# Patient Record
Sex: Female | Born: 1990 | Hispanic: Yes | Marital: Single | Smoking: Never smoker
Health system: Southern US, Community
[De-identification: ages and names within clinical notes are randomized; demographics above are authoritative.]

## PROBLEM LIST (undated history)

## (undated) DIAGNOSIS — N200 Calculus of kidney: Secondary | ICD-10-CM

---

## 2017-12-01 DIAGNOSIS — Z3041 Encounter for surveillance of contraceptive pills: Secondary | ICD-10-CM | POA: Insufficient documentation

## 2019-03-03 ENCOUNTER — Emergency Department: Payer: Managed Care, Other (non HMO)

## 2019-03-03 ENCOUNTER — Encounter: Payer: Self-pay | Admitting: Emergency Medicine

## 2019-03-03 ENCOUNTER — Emergency Department
Admission: EM | Admit: 2019-03-03 | Discharge: 2019-03-03 | Disposition: A | Discharge status: Home / Self Care | Payer: Managed Care, Other (non HMO) | Attending: Emergency Medicine | Admitting: Emergency Medicine

## 2019-03-03 DIAGNOSIS — N200 Calculus of kidney: Secondary | ICD-10-CM | POA: Insufficient documentation

## 2019-03-03 DIAGNOSIS — Z79899 Other long term (current) drug therapy: Secondary | ICD-10-CM | POA: Insufficient documentation

## 2019-03-03 DIAGNOSIS — R1011 Right upper quadrant pain: Secondary | ICD-10-CM | POA: Diagnosis present

## 2019-03-03 LAB — BASIC METABOLIC PANEL
Anion gap: 11 (ref 5–15)
BUN: 14 mg/dL (ref 6–20)
CO2: 27 mmol/L (ref 22–32)
Calcium: 9.3 mg/dL (ref 8.9–10.3)
Chloride: 103 mmol/L (ref 98–111)
Creatinine, Ser: 1 mg/dL (ref 0.44–1.00)
GFR calc Af Amer: >60 mL/min (ref >60)
GFR calc non Af Amer: >60 mL/min (ref >60)
Glucose, Bld: 123 mg/dL — ABNORMAL HIGH (ref 70–99)
Potassium: 4 mmol/L (ref 3.5–5.1)
Sodium: 141 mmol/L (ref 135–145)

## 2019-03-03 LAB — URINALYSIS, COMPLETE (UACMP) WITH MICROSCOPIC
Bilirubin Urine: NEGATIVE
Glucose, UA: NEGATIVE mg/dL
Ketones, ur: 5 mg/dL — AB
Leukocytes,Ua: NEGATIVE
Nitrite: NEGATIVE
Protein, ur: 30 mg/dL — AB
RBC / HPF: >50 RBC/hpf — ABNORMAL HIGH (ref 0–5)
Specific Gravity, Urine: 1.023 (ref 1.005–1.030)
pH: 6 (ref 5.0–8.0)

## 2019-03-03 LAB — CBC
HCT: 43.5 % (ref 36.0–46.0)
Hemoglobin: 14.6 g/dL (ref 12.0–15.0)
MCH: 30.5 pg (ref 26.0–34.0)
MCHC: 33.6 g/dL (ref 30.0–36.0)
MCV: 91 fL (ref 80.0–100.0)
Platelets: 268 10*3/uL (ref 150–400)
RBC: 4.78 MIL/uL (ref 3.87–5.11)
RDW: 11.9 % (ref 11.5–15.5)
WBC: 12 10*3/uL — ABNORMAL HIGH (ref 4.0–10.5)
nRBC: 0 % (ref 0.0–0.2)

## 2019-03-03 MED ORDER — HYDROCODONE-ACETAMINOPHEN 5-325 MG PO TABS: 1.0000 | ORAL_TABLET | Freq: Four times a day (QID) | ORAL | 0 refills | Status: DC | PRN

## 2019-03-03 MED ORDER — ONDANSETRON HCL 4 MG/2ML IJ SOLN
4.0000 mg | Freq: Once | INTRAMUSCULAR | Status: AC
  Administered 2019-03-03: 4 mg via INTRAVENOUS
  Filled 2019-03-03: qty 2

## 2019-03-03 MED ORDER — NAPROXEN 500 MG PO TABS: 500.0000 mg | ORAL_TABLET | Freq: Two times a day (BID) | ORAL | 2 refills | Status: DC

## 2019-03-03 MED ORDER — KETOROLAC TROMETHAMINE 30 MG/ML IJ SOLN
30.0000 mg | Freq: Once | INTRAMUSCULAR | Status: AC
  Administered 2019-03-03: 30 mg via INTRAVENOUS
  Filled 2019-03-03: qty 1

## 2019-03-03 MED ORDER — ONDANSETRON 4 MG PO TBDP: 4.0000 mg | ORAL_TABLET | Freq: Three times a day (TID) | ORAL | 0 refills | Status: DC | PRN

## 2019-03-03 NOTE — ED Triage Notes (Signed)
Pt to ED with c/o of right side pain that started this morning. Pt states N/V but denies diarrhea.Pt states she has noticed burning with urination. Pt is afebrile in triage.

## 2019-03-03 NOTE — ED Notes (Signed)
Negative pregnancy

## 2019-03-03 NOTE — ED Provider Notes (Signed)
[facility] Emergency Department Provider Note   ____________________________________________    I have reviewed the triage vital signs and the nursing notes.   HISTORY  Chief Complaint Flank Pain     HPI Andrea Castillo is a 28 y.o. female who presents with complaints of right flank pain, patient reports the pain started about 2 hours prior to arrival she describes sharp pain radiating into her groin on the right.  No history of similar symptoms in the past.  No fevers or chills.  Positive nausea.  Is not take anything for this.  Reports she is to start her period in 1 week.  Has not noticed any hematuria, no dysuria.  History reviewed. No pertinent past medical history.  There are no active problems to display for this patient.   History reviewed. No pertinent surgical history.  Prior to Admission medications   Medication Sig Start Date End Date Taking? Authorizing Provider  HYDROcodone-acetaminophen (NORCO/VICODIN) 5-325 MG tablet Take 1 tablet by mouth every 6 (six) hours as needed for severe pain. 03/03/19   Jene EveryKinner, Raaga Maeder, MD  naproxen (NAPROSYN) 500 MG tablet Take 1 tablet (500 mg total) by mouth 2 (two) times daily with a meal. 03/03/19   Jene EveryKinner, Aerial Dilley, MD  ondansetron (ZOFRAN ODT) 4 MG disintegrating tablet Take 1 tablet (4 mg total) by mouth every 8 (eight) hours as needed. 03/03/19   Jene EveryKinner, Konnie Noffsinger, MD     Allergies Patient has no known allergies.  History reviewed. No pertinent family history.  Social History Social History   Tobacco Use  . Smoking status: Never Smoker  . Smokeless tobacco: Never Used  Substance Use Topics  . Alcohol use: Not Currently  . Drug use: Yes    Types: Marijuana    Review of Systems  Constitutional: No fever/chills Eyes: No visual changes.  ENT: No sore throat. Cardiovascular: Denies chest pain. Respiratory: Denies shortness of breath. Gastrointestinal: As above Genitourinary: As above  Musculoskeletal: Negative for back pain. Skin: Negative for rash. Neurological: Negative for headaches or weakness   ____________________________________________   PHYSICAL EXAM:  VITAL SIGNS: ED Triage Vitals  Enc Vitals Group     BP 03/03/19 2104 (!) 142/89     Pulse Rate 03/03/19 2104 74     Resp 03/03/19 2104 18     Temp 03/03/19 2104 97.6 F (36.4 C)     Temp Source 03/03/19 2104 Oral     SpO2 03/03/19 2104 100 %     Weight 03/03/19 2105 73.5 kg (162 lb)     Height 03/03/19 2105 1.575 m (5\' 2" )     Head Circumference --      Peak Flow --      Pain Score 03/03/19 2104 10     Pain Loc --      Pain Edu? --      Excl. in GC? --     Constitutional: Alert and oriented.  Uncomfortable, sitting on bench Eyes: Conjunctivae are normal.   Nose: No congestion/rhinnorhea. Mouth/Throat: Mucous membranes are moist.   Neck:  Painless ROM Cardiovascular: Normal rate, regular rhythm.  Good peripheral circulation. Respiratory: Normal respiratory effort.  No retractions.  Gastrointestinal: Soft and nontender. No distention.  No CVA tenderness.  Musculoskeletal:.  Warm and well perfused Neurologic:  Normal speech and language. No gross focal neurologic deficits are appreciated.  Skin:  Skin is warm, dry and intact. No rash noted. Psychiatric: Mood and affect are normal. Speech and behavior are normal.  ____________________________________________   LABS (all labs ordered are listed, but only abnormal results are displayed)  Labs Reviewed  URINALYSIS, COMPLETE (UACMP) WITH MICROSCOPIC - Abnormal; Notable for the following components:      Result Value   Color, Urine YELLOW (*)    APPearance CLOUDY (*)    Hgb urine dipstick LARGE (*)    Ketones, ur 5 (*)    Protein, ur 30 (*)    RBC / HPF >50 (*)    Bacteria, UA RARE (*)    All other components within normal limits  BASIC METABOLIC PANEL - Abnormal; Notable for the following components:   Glucose, Bld 123 (*)    All  other components within normal limits  CBC - Abnormal; Notable for the following components:   WBC 12.0 (*)    All other components within normal limits  POC URINE PREG, ED   ____________________________________________  EKG   ____________________________________________  RADIOLOGY  CT scan demonstrates 6 mm right ureteral stone ____________________________________________   PROCEDURES  Procedure(s) performed: No  Procedures   Critical Care performed: No ____________________________________________   INITIAL IMPRESSION / ASSESSMENT AND PLAN / ED COURSE  Pertinent labs & imaging results that were available during my care of the patient were reviewed by me and considered in my medical decision making (see chart for details).  Patient's abrupt onset of pain is suspicious for ureterolithiasis, UTI /pyelonephritis is also on the differential.  Pregnancy test is negative we will give IV Toradol, IV Zofran obtain CT renal stone study and reevaluate   Urinalysis demonstrates hemoglobin, no evidence of infection, CT confirms 6 mm stone UVJ.  Patient's pain is relieved after Toradol, will discharge with analgesics Zofran, outpatient follow-up with urology, return precautions discussed    ____________________________________________   FINAL CLINICAL IMPRESSION(S) / ED DIAGNOSES  Final diagnoses:  Kidney stone        Note:  This document was prepared using Dragon voice recognition software and may include unintentional dictation errors.   Jene Every, MD 03/03/19 [phone]

## 2019-03-07 ENCOUNTER — Emergency Department
Admission: EM | Admit: 2019-03-07 | Discharge: 2019-03-07 | Disposition: A | Discharge status: Home / Self Care | Payer: Managed Care, Other (non HMO) | Attending: Student in an Organized Health Care Education/Training Program | Admitting: Student in an Organized Health Care Education/Training Program

## 2019-03-07 ENCOUNTER — Other Ambulatory Visit: Payer: Self-pay

## 2019-03-07 ENCOUNTER — Emergency Department: Payer: Managed Care, Other (non HMO)

## 2019-03-07 DIAGNOSIS — R110 Nausea: Secondary | ICD-10-CM | POA: Diagnosis not present

## 2019-03-07 DIAGNOSIS — R109 Unspecified abdominal pain: Secondary | ICD-10-CM

## 2019-03-07 DIAGNOSIS — N201 Calculus of ureter: Secondary | ICD-10-CM | POA: Diagnosis not present

## 2019-03-07 DIAGNOSIS — F1210 Cannabis abuse, uncomplicated: Secondary | ICD-10-CM | POA: Diagnosis not present

## 2019-03-07 HISTORY — DX: Calculus of kidney: N20.0

## 2019-03-07 LAB — BASIC METABOLIC PANEL
Anion gap: 12 (ref 5–15)
BUN: 16 mg/dL (ref 6–20)
CO2: 22 mmol/L (ref 22–32)
Calcium: 8.6 mg/dL — ABNORMAL LOW (ref 8.9–10.3)
Chloride: 102 mmol/L (ref 98–111)
Creatinine, Ser: 1.22 mg/dL — ABNORMAL HIGH (ref 0.44–1.00)
GFR calc Af Amer: >60 mL/min (ref >60)
GFR calc non Af Amer: >60 mL/min (ref >60)
Glucose, Bld: 86 mg/dL (ref 70–99)
Potassium: 4.2 mmol/L (ref 3.5–5.1)
Sodium: 136 mmol/L (ref 135–145)

## 2019-03-07 LAB — CBC WITH DIFFERENTIAL/PLATELET
Abs Immature Granulocytes: 0.04 10*3/uL (ref 0.00–0.07)
Basophils Absolute: 0 10*3/uL (ref 0.0–0.1)
Basophils Relative: 0 %
Eosinophils Absolute: 0.1 10*3/uL (ref 0.0–0.5)
Eosinophils Relative: 1 %
HCT: 41.8 % (ref 36.0–46.0)
Hemoglobin: 14 g/dL (ref 12.0–15.0)
Immature Granulocytes: 0 %
Lymphocytes Relative: 8 %
Lymphs Abs: 0.9 10*3/uL (ref 0.7–4.0)
MCH: 30.2 pg (ref 26.0–34.0)
MCHC: 33.5 g/dL (ref 30.0–36.0)
MCV: 90.1 fL (ref 80.0–100.0)
Monocytes Absolute: 0.8 10*3/uL (ref 0.1–1.0)
Monocytes Relative: 7 %
Neutro Abs: 10.2 10*3/uL — ABNORMAL HIGH (ref 1.7–7.7)
Neutrophils Relative %: 84 %
Platelets: 252 10*3/uL (ref 150–400)
RBC: 4.64 MIL/uL (ref 3.87–5.11)
RDW: 12 % (ref 11.5–15.5)
WBC: 12 10*3/uL — ABNORMAL HIGH (ref 4.0–10.5)
nRBC: 0 % (ref 0.0–0.2)

## 2019-03-07 LAB — URINALYSIS, COMPLETE (UACMP) WITH MICROSCOPIC
Bacteria, UA: NONE SEEN
Bilirubin Urine: NEGATIVE
Glucose, UA: NEGATIVE mg/dL
Ketones, ur: 80 mg/dL — AB
Nitrite: NEGATIVE
Protein, ur: 30 mg/dL — AB
Specific Gravity, Urine: 1.025 (ref 1.005–1.030)
pH: 5 (ref 5.0–8.0)

## 2019-03-07 MED ORDER — MORPHINE SULFATE (PF) 4 MG/ML IV SOLN
4.0000 mg | INTRAVENOUS | Status: DC | PRN
  Administered 2019-03-07: 13:00:00 4 mg via INTRAVENOUS
  Filled 2019-03-07: qty 1

## 2019-03-07 MED ORDER — CEPHALEXIN 500 MG PO CAPS
500.0000 mg | ORAL_CAPSULE | Freq: Once | ORAL | Status: AC
  Administered 2019-03-07: 16:00:00 500 mg via ORAL
  Filled 2019-03-07: qty 1

## 2019-03-07 MED ORDER — ONDANSETRON HCL 4 MG/2ML IJ SOLN
4.0000 mg | Freq: Once | INTRAMUSCULAR | Status: AC
  Administered 2019-03-07: 4 mg via INTRAVENOUS
  Filled 2019-03-07: qty 2

## 2019-03-07 MED ORDER — HYDROCODONE-ACETAMINOPHEN 5-325 MG PO TABS: 1.0000 | ORAL_TABLET | ORAL | 0 refills | Status: DC | PRN

## 2019-03-07 MED ORDER — CEPHALEXIN 500 MG PO CAPS: 500.0000 mg | ORAL_CAPSULE | Freq: Three times a day (TID) | ORAL | 0 refills | Status: DC

## 2019-03-07 MED ORDER — PROCHLORPERAZINE MALEATE 5 MG PO TABS: 5.0000 mg | ORAL_TABLET | Freq: Four times a day (QID) | ORAL | 0 refills | Status: DC | PRN

## 2019-03-07 NOTE — H&P (Signed)
Urology Consult  I have been asked to see the patient by Dr. Roxan Hockeyobinson, for evaluation and management of right ureteral stone.  Chief Complaint: right flank pain  History of Present Illness: March RummageMaria Elbia Castillo is a 28 y.o. year old female presenting to the emergency room with a 6 mm right distal ureteral calculus.  She was seen on 5/23 in the emergency room with acute onset right lower quadrant pain found to have a 6 mm right UVJ stone.  At that time, there is no concern for infection she was sent home with pain medications and expectant management.  She continued to have intermittent episodes of right lower quadrant pain, nausea with occasional vomiting, and dark urine.  No fevers or chills.  No dysuria.  No urgency or frequency.  She returned to the emergency room again today with ongoing severe pain.  No personal history of kidney stones.  Past Medical History:  Diagnosis Date   Kidney stone     History reviewed. No pertinent surgical history.  Home Medications:  No outpatient medications have been marked as taking for the 03/07/19 encounter [facility](Hospital Encounter).    Allergies: No Known Allergies  No family history on file.  Social History:  reports that she has never smoked. She has never used smokeless tobacco. She reports previous alcohol use. She reports current drug use. Drug: Marijuana.  ROS: A complete review of systems was performed.  All systems are negative except for pertinent findings as noted.  Physical Exam:  Vital signs in last 24 hours: Temp:  [98.2 F (36.8 C)] 98.2 F (36.8 C) (05/27 1052) Pulse Rate:  [72-83] 75 (05/27 1530) Resp:  [16] 16 (05/27 1052) BP: (125-131)/(78-83) 125/83 (05/27 1530) SpO2:  [98 %-100 %] 100 % (05/27 1530) Weight:  [73.5 kg] 73.5 kg (05/27 1054) Constitutional:  Alert and oriented, No acute distress HEENT: [place] AT, moist mucus membranes.  Trachea midline, no masses Cardiovascular: Regular rate and rhythm, no  clubbing, cyanosis, or edema. Respiratory: Normal respiratory effort, lungs clear bilaterally GI: Abdomen is soft, mild tenderness in right lower quadrant GU: No CVA tenderness Skin: No rashes, bruises or suspicious lesions Neurologic: Grossly intact, no focal deficits, moving all 4 extremities Psychiatric: Normal mood and affect   Laboratory Data:  Recent Labs    03/07/19 1248  WBC 12.0*  HGB 14.0  HCT 41.8   Recent Labs    03/07/19 1248  NA 136  K 4.2  CL 102  CO2 22  GLUCOSE 86  BUN 16  CREATININE 1.22*  CALCIUM 8.6*   Component     Latest Ref Rng & Units 03/07/2019  Color, Urine     YELLOW YELLOW (A)  Appearance     CLEAR HAZY (A)  Specific Gravity, Urine     1.005 - 1.030 1.025  pH     5.0 - 8.0 5.0  Glucose, UA     NEGATIVE mg/dL NEGATIVE  Hgb urine dipstick     NEGATIVE MODERATE (A)  Bilirubin Urine     NEGATIVE NEGATIVE  Ketones, ur     NEGATIVE mg/dL 80 (A)  Protein     NEGATIVE mg/dL 30 (A)  Nitrite     NEGATIVE NEGATIVE  Leukocytes,Ua     NEGATIVE SMALL (A)  RBC / HPF     0 - 5 RBC/hpf 11-20  WBC, UA     0 - 5 WBC/hpf 21-50  Bacteria, UA     NONE SEEN NONE SEEN  Squamous Epithelial / LPF     0 - 5 0-5  Mucus      PRESENT    Radiologic Imaging: Dg Abdomen 1 View  Result Date: 03/07/2019 CLINICAL DATA:  Right flank pain, recent distal right ureteral stone EXAM: ABDOMEN - 1 VIEW COMPARISON:  03/03/2019 CT abdomen/pelvis FINDINGS: Persistent 3 mm calcification to the right of coccyx, cannot exclude persistent right UVJ stone. Additional small round calcifications in the deep pelvis bilaterally, favor calcified venous phleboliths. No radiopaque stones overlying the kidneys. No dilated small bowel loops. Mild-to-moderate colonic stool. No evidence of pneumatosis or pneumoperitoneum. IMPRESSION: 3 mm calcification to the right of the coccyx, cannot exclude a persistent right UVJ stone. Electronically Signed   By: Delbert Phenix M.D.   On:  03/07/2019 13:18   CT scan was personally reviewed.  Stone is seen at the UVJ.  Was compared to CT scan from 03/03/2019.  Impression/ Plan:  1.  Right distal ureteral calculus- ED visit x2 for ongoing severe right lower quadrant pain.  Reviewed based on the size and location, she does have a chance of passing the stone spontaneously but her pain is poorly controlled.  As such, alternative options were discussed in detail including ureteroscopy and ESWL.  Risk and benefits of each were discussed in detail.  She is most interested in pursuing ESWL and she understands that this has a lower stone clearance rate.  She understands the risk of the procedure including risk of discomfort, need for further procedures, UTI, damage to surrounding structures, hematuria amongst others.  All questions were answered.  Stone is visible on KUB today.  Her urine does not appear grossly infected today, no bacteria and more consistent with contamination and irritation from the stone.  However as a precaution I have asked that the ER started on Keflex which she will start today.  In light of COVID-19, I have asked the ER to test her today prior to discharge for pre-procedure.  2.  Acute kidney injury- likely secondary to poor p.o. intake and urinary obstruction.  Will resolve with treatment of #1.  3.  Right hydronephrosis-secondary #1    03/07/2019, 3:45 PM  Vanna Scotland,  MD

## 2019-03-07 NOTE — ED Provider Notes (Signed)
Patient was seen and evaluated by Dr. Apolinar Junes of urology.  Plan for outpatient lithotripsy tomorrow morning.  She has requested prescription for Keflex due to probable contaminated urine but as she is receiving procedure tomorrow will prophylaxis.  Patient's pain controlled.  Have discussed with the patient and available family all diagnostics and treatments performed thus far and all questions were answered to the best of my ability. The patient demonstrates understanding and agreement with plan.    Willy Eddy, MD 03/07/19 1505

## 2019-03-07 NOTE — Discharge Instructions (Signed)
You will need to return to the Medical [facility] tomorrow morning for lithotripsy as you discussed with Dr.Brandon.  You have been prescribed additional pain medication as well as an antibiotic sent to your pharmacy.  Return for any additional questions or concerns.

## 2019-03-07 NOTE — ED Provider Notes (Signed)
[facility] Emergency Department Provider Note    First MD Initiated Contact with Patient 03/07/19 1239     (approximate)  I have reviewed the triage vital signs and the nursing notes.   HISTORY  Chief Complaint Flank Pain    HPI Andrea Castillo is a 28 y.o. female with recent diagnosis of right ureterolithiasis who presented to the ER several days later for persistent pain.  States his been taking medications as scheduled without any improvement.  Denies any fevers but is having nausea and having difficulty staying hydrated.  Denies any diarrhea.  No fevers.  No chills.  No chest pain or shortness of breath.    Past Medical History:  Diagnosis Date  . Kidney stone    No family history on file. History reviewed. No pertinent surgical history. There are no active problems to display for this patient.     Prior to Admission medications   Medication Sig Start Date End Date Taking? Authorizing Provider  HYDROcodone-acetaminophen (NORCO) 5-325 MG tablet Take 1 tablet by mouth every 4 (four) hours as needed for moderate pain. 03/07/19   Willy Eddy, MD  HYDROcodone-acetaminophen (NORCO/VICODIN) 5-325 MG tablet Take 1 tablet by mouth every 6 (six) hours as needed for severe pain. 03/03/19   Jene Every, MD  naproxen (NAPROSYN) 500 MG tablet Take 1 tablet (500 mg total) by mouth 2 (two) times daily with a meal. 03/03/19   Jene Every, MD  ondansetron (ZOFRAN ODT) 4 MG disintegrating tablet Take 1 tablet (4 mg total) by mouth every 8 (eight) hours as needed. 03/03/19   Jene Every, MD  prochlorperazine (COMPAZINE) 5 MG tablet Take 1 tablet (5 mg total) by mouth every 6 (six) hours as needed for nausea or vomiting. 03/07/19   Willy Eddy, MD    Allergies Patient has no known allergies.    Social History Social History   Tobacco Use  . Smoking status: Never Smoker  . Smokeless tobacco: Never Used  Substance Use Topics  . Alcohol  use: Not Currently  . Drug use: Yes    Types: Marijuana    Review of Systems Patient denies headaches, rhinorrhea, blurry vision, numbness, shortness of breath, chest pain, edema, cough, abdominal pain, nausea, vomiting, diarrhea, dysuria, fevers, rashes or hallucinations unless otherwise stated above in HPI. ____________________________________________   PHYSICAL EXAM:  VITAL SIGNS: Vitals:   03/07/19 1308 03/07/19 1309  BP: 125/78   Pulse:  83  Resp:    Temp:    SpO2:  100%    Constitutional: Alert and oriented.  Eyes: Conjunctivae are normal.  Head: Atraumatic. Nose: No congestion/rhinnorhea. Mouth/Throat: Mucous membranes are moist.   Neck: No stridor. Painless ROM.  Cardiovascular: Normal rate, regular rhythm. Grossly normal heart sounds.  Good peripheral circulation. Respiratory: Normal respiratory effort.  No retractions. Lungs CTAB. Gastrointestinal: Soft and nontender. No distention. No abdominal bruits. No CVA tenderness. Genitourinary:  Musculoskeletal: No lower extremity tenderness nor edema.  No joint effusions. Neurologic:  Normal speech and language. No gross focal neurologic deficits are appreciated. No facial droop Skin:  Skin is warm, dry and intact. No rash noted. Psychiatric: Mood and affect are normal. Speech and behavior are normal.  ____________________________________________   LABS (all labs ordered are listed, but only abnormal results are displayed)  Results for orders placed or performed during the hospital encounter of 03/07/19 (from the past 24 hour(s))  CBC with Differential/Platelet     Status: Abnormal   Collection Time: 03/07/19 12:48 PM  Result Value Ref Range   WBC 12.0 (H) 4.0 - 10.5 K/uL   RBC 4.64 3.87 - 5.11 MIL/uL   Hemoglobin 14.0 12.0 - 15.0 g/dL   HCT 16.141.8 09.636.0 - 04.546.0 %   MCV 90.1 80.0 - 100.0 fL   MCH 30.2 26.0 - 34.0 pg   MCHC 33.5 30.0 - 36.0 g/dL   RDW 40.912.0 81.111.5 - 91.415.5 %   Platelets 252 150 - 400 K/uL   nRBC 0.0  0.0 - 0.2 %   Neutrophils Relative % 84 %   Neutro Abs 10.2 (H) 1.7 - 7.7 K/uL   Lymphocytes Relative 8 %   Lymphs Abs 0.9 0.7 - 4.0 K/uL   Monocytes Relative 7 %   Monocytes Absolute 0.8 0.1 - 1.0 K/uL   Eosinophils Relative 1 %   Eosinophils Absolute 0.1 0.0 - 0.5 K/uL   Basophils Relative 0 %   Basophils Absolute 0.0 0.0 - 0.1 K/uL   Immature Granulocytes 0 %   Abs Immature Granulocytes 0.04 0.00 - 0.07 K/uL  Basic metabolic panel     Status: Abnormal   Collection Time: 03/07/19 12:48 PM  Result Value Ref Range   Sodium 136 135 - 145 mmol/L   Potassium 4.2 3.5 - 5.1 mmol/L   Chloride 102 98 - 111 mmol/L   CO2 22 22 - 32 mmol/L   Glucose, Bld 86 70 - 99 mg/dL   BUN 16 6 - 20 mg/dL   Creatinine, Ser 7.821.22 (H) 0.44 - 1.00 mg/dL   Calcium 8.6 (L) 8.9 - 10.3 mg/dL   GFR calc non Af Amer >60 >60 mL/min   GFR calc Af Amer >60 >60 mL/min   Anion gap 12 5 - 15  Urinalysis, Complete w Microscopic     Status: Abnormal   Collection Time: 03/07/19  1:22 PM  Result Value Ref Range   Color, Urine YELLOW (A) YELLOW   APPearance HAZY (A) CLEAR   Specific Gravity, Urine 1.025 1.005 - 1.030   pH 5.0 5.0 - 8.0   Glucose, UA NEGATIVE NEGATIVE mg/dL   Hgb urine dipstick MODERATE (A) NEGATIVE   Bilirubin Urine NEGATIVE NEGATIVE   Ketones, ur 80 (A) NEGATIVE mg/dL   Protein, ur 30 (A) NEGATIVE mg/dL   Nitrite NEGATIVE NEGATIVE   Leukocytes,Ua SMALL (A) NEGATIVE   RBC / HPF 11-20 0 - 5 RBC/hpf   WBC, UA 21-50 0 - 5 WBC/hpf   Bacteria, UA NONE SEEN NONE SEEN   Squamous Epithelial / LPF 0-5 0 - 5   Mucus PRESENT    ____________________________________________  ____________________________________________  RADIOLOGY  I personally reviewed all radiographic images ordered to evaluate for the above acute complaints and reviewed radiology reports and findings.  These findings were personally discussed with the patient.  Please see medical record for radiology report.   ____________________________________________   PROCEDURES  Procedure(s) performed:  Procedures    Critical Care performed: no ____________________________________________   INITIAL IMPRESSION / ASSESSMENT AND PLAN / ED COURSE  Pertinent labs & imaging results that were available during my care of the patient were reviewed by me and considered in my medical decision making (see chart for details).   DDX: stone, pyelo, cystitis, ureteral colic  March RummageMaria Elbia Cowin is a 28 y.o. who presents to the ED with known ureterolithiasis representing the ER for persistent pain.  She is afebrile hemodynamically stable.  Does have persistent leukocytosis without any increase.  No sign of infection.  Will provide IV fluids for dehydration  as well as IV pain medication.  Clinical Course as of Mar 06 1445  Wed Mar 07, 2019  1417 Discussed case with Dr. Apolinar Junes of urology who kindly agrees to evaluate patient at bedside for possible lithotripsy given her persistent symptoms.   [PR]    Clinical Course User Index [PR] Willy Eddy, MD    The patient was evaluated in Emergency Department today for the symptoms described in the history of present illness. He/she was evaluated in the context of the global COVID-19 pandemic, which necessitated consideration that the patient might be at risk for infection with the SARS-CoV-2 virus that causes COVID-19. Institutional protocols and algorithms that pertain to the evaluation of patients at risk for COVID-19 are in a state of rapid change based on information released by regulatory bodies including the CDC and federal and state organizations. These policies and algorithms were followed during the patient's care in the ED.  As part of my medical decision making, I reviewed the following data within the electronic MEDICAL RECORD NUMBER Nursing notes reviewed and incorporated, Labs reviewed, notes from prior ED visits and [place] Controlled Substance Database    ____________________________________________   FINAL CLINICAL IMPRESSION(S) / ED DIAGNOSES  Final diagnoses:  Ureterolithiasis      NEW MEDICATIONS STARTED DURING THIS VISIT:  New Prescriptions   HYDROCODONE-ACETAMINOPHEN (NORCO) 5-325 MG TABLET    Take 1 tablet by mouth every 4 (four) hours as needed for moderate pain.   PROCHLORPERAZINE (COMPAZINE) 5 MG TABLET    Take 1 tablet (5 mg total) by mouth every 6 (six) hours as needed for nausea or vomiting.     Note:  This document was prepared using Dragon voice recognition software and may include unintentional dictation errors.    Willy Eddy, MD 03/07/19 1446

## 2019-03-07 NOTE — ED Triage Notes (Signed)
Pt states she was seen here on Saturday and dx with a kidney stone, pt states the pain meds help a little but is not able to go the full 6hours without needing to take more pain meds.

## 2019-03-07 NOTE — H&P (View-Only) (Signed)
°  ° °Urology Consult ° °I have been asked to see the patient by Dr. Robinson, for evaluation and management of right ureteral stone. ° °Chief Complaint: right flank pain ° °History of Present Illness: Andrea Castillo is a 28 y.o. year old female presenting to the emergency room with a 6 mm right distal ureteral calculus. ° °She was seen on 5/23 in the emergency room with acute onset right lower quadrant pain found to have a 6 mm right UVJ stone.  At that time, there is no concern for infection she was sent home with pain medications and expectant management. ° °She continued to have intermittent episodes of right lower quadrant pain, nausea with occasional vomiting, and dark urine.  No fevers or chills.  No dysuria.  No urgency or frequency. ° °She returned to the emergency room again today with ongoing severe pain. ° °No personal history of kidney stones. ° °Past Medical History:  °Diagnosis Date  °• Kidney stone   ° ° °History reviewed. No pertinent surgical history. ° °Home Medications:  °No outpatient medications have been marked as taking for the 03/07/19 encounter (Hospital Encounter).  ° ° °Allergies: No Known Allergies ° °No family history on file. ° °Social History:  reports that she has never smoked. She has never used smokeless tobacco. She reports previous alcohol use. She reports current drug use. Drug: Marijuana. ° °ROS: °A complete review of systems was performed.  All systems are negative except for pertinent findings as noted. ° °Physical Exam:  °Vital signs in last 24 hours: °Temp:  [98.2 °F (36.8 °C)] 98.2 °F (36.8 °C) (05/27 1052) °Pulse Rate:  [72-83] 75 (05/27 1530) °Resp:  [16] 16 (05/27 1052) °BP: (125-131)/(78-83) 125/83 (05/27 1530) °SpO2:  [98 %-100 %] 100 % (05/27 1530) °Weight:  [73.5 kg] 73.5 kg (05/27 1054) °Constitutional:  Alert and oriented, No acute distress °HEENT: [place] AT, moist mucus membranes.  Trachea midline, no masses °Cardiovascular: Regular rate and rhythm, no  clubbing, cyanosis, or edema. °Respiratory: Normal respiratory effort, lungs clear bilaterally °GI: Abdomen is soft, mild tenderness in right lower quadrant °GU: No CVA tenderness °Skin: No rashes, bruises or suspicious lesions °Neurologic: Grossly intact, no focal deficits, moving all 4 extremities °Psychiatric: Normal mood and affect ° ° °Laboratory Data:  °Recent Labs  °  03/07/19 °1248  °WBC 12.0*  °HGB 14.0  °HCT 41.8  ° °Recent Labs  °  03/07/19 °1248  °NA 136  °K 4.2  °CL 102  °CO2 22  °GLUCOSE 86  °BUN 16  °CREATININE 1.22*  °CALCIUM 8.6*  ° °Component °    Latest Ref Rng & Units 03/07/2019  °Color, Urine °    YELLOW YELLOW (A)  °Appearance °    CLEAR HAZY (A)  °Specific Gravity, Urine °    1.005 - 1.030 1.025  °pH °    5.0 - 8.0 5.0  °Glucose, UA °    NEGATIVE mg/dL NEGATIVE  °Hgb urine dipstick °    NEGATIVE MODERATE (A)  °Bilirubin Urine °    NEGATIVE NEGATIVE  °Ketones, ur °    NEGATIVE mg/dL 80 (A)  °Protein °    NEGATIVE mg/dL 30 (A)  °Nitrite °    NEGATIVE NEGATIVE  °Leukocytes,Ua °    NEGATIVE SMALL (A)  °RBC / HPF °    0 - 5 RBC/hpf 11-20  °WBC, UA °    0 - 5 WBC/hpf 21-50  °Bacteria, UA °    NONE SEEN NONE SEEN  °  Squamous Epithelial / LPF     0 - 5 0-5  Mucus      PRESENT    Radiologic Imaging: Dg Abdomen 1 View  Result Date: 03/07/2019 CLINICAL DATA:  Right flank pain, recent distal right ureteral stone EXAM: ABDOMEN - 1 VIEW COMPARISON:  03/03/2019 CT abdomen/pelvis FINDINGS: Persistent 3 mm calcification to the right of coccyx, cannot exclude persistent right UVJ stone. Additional small round calcifications in the deep pelvis bilaterally, favor calcified venous phleboliths. No radiopaque stones overlying the kidneys. No dilated small bowel loops. Mild-to-moderate colonic stool. No evidence of pneumatosis or pneumoperitoneum. IMPRESSION: 3 mm calcification to the right of the coccyx, cannot exclude a persistent right UVJ stone. Electronically Signed   By: Delbert Phenix M.D.   On:  03/07/2019 13:18   CT scan was personally reviewed.  Stone is seen at the UVJ.  Was compared to CT scan from 03/03/2019.  Impression/ Plan:  1.  Right distal ureteral calculus- ED visit x2 for ongoing severe right lower quadrant pain.  Reviewed based on the size and location, she does have a chance of passing the stone spontaneously but her pain is poorly controlled.  As such, alternative options were discussed in detail including ureteroscopy and ESWL.  Risk and benefits of each were discussed in detail.  She is most interested in pursuing ESWL and she understands that this has a lower stone clearance rate.  She understands the risk of the procedure including risk of discomfort, need for further procedures, UTI, damage to surrounding structures, hematuria amongst others.  All questions were answered.  Stone is visible on KUB today.  Her urine does not appear grossly infected today, no bacteria and more consistent with contamination and irritation from the stone.  However as a precaution I have asked that the ER started on Keflex which she will start today.  In light of COVID-19, I have asked the ER to test her today prior to discharge for pre-procedure.  2.  Acute kidney injury- likely secondary to poor p.o. intake and urinary obstruction.  Will resolve with treatment of #1.  3.  Right hydronephrosis-secondary #1    03/07/2019, 3:45 PM  Vanna Scotland,  MD

## 2019-03-08 ENCOUNTER — Ambulatory Visit
Admission: RE | Admit: 2019-03-08 | Discharge: 2019-03-08 | Disposition: A | Discharge status: Home / Self Care | Payer: Managed Care, Other (non HMO) | Attending: Urology | Admitting: Urology

## 2019-03-08 ENCOUNTER — Encounter
Admission: RE | Disposition: A | Discharge status: Home / Self Care | Payer: Self-pay | Source: Home / Self Care | Attending: Urology

## 2019-03-08 ENCOUNTER — Encounter: Payer: Self-pay | Admitting: *Deleted

## 2019-03-08 ENCOUNTER — Ambulatory Visit: Payer: Managed Care, Other (non HMO)

## 2019-03-08 ENCOUNTER — Encounter: Payer: Self-pay | Admitting: Anesthesiology

## 2019-03-08 DIAGNOSIS — F17200 Nicotine dependence, unspecified, uncomplicated: Secondary | ICD-10-CM | POA: Diagnosis not present

## 2019-03-08 DIAGNOSIS — Z1159 Encounter for screening for other viral diseases: Secondary | ICD-10-CM | POA: Diagnosis not present

## 2019-03-08 DIAGNOSIS — N179 Acute kidney failure, unspecified: Secondary | ICD-10-CM | POA: Diagnosis not present

## 2019-03-08 DIAGNOSIS — N200 Calculus of kidney: Secondary | ICD-10-CM

## 2019-03-08 DIAGNOSIS — N132 Hydronephrosis with renal and ureteral calculous obstruction: Secondary | ICD-10-CM | POA: Insufficient documentation

## 2019-03-08 HISTORY — PX: EXTRACORPOREAL SHOCK WAVE LITHOTRIPSY: SHX1557

## 2019-03-08 LAB — SARS CORONAVIRUS 2 BY RT PCR (HOSPITAL ORDER, PERFORMED IN ~~LOC~~ HOSPITAL LAB): SARS Coronavirus 2: NEGATIVE

## 2019-03-08 LAB — POCT PREGNANCY, URINE: Preg Test, Ur: NEGATIVE

## 2019-03-08 SURGERY — LITHOTRIPSY, ESWL
Anesthesia: Choice | Laterality: Right

## 2019-03-08 MED ORDER — DIAZEPAM 5 MG PO TABS
ORAL_TABLET | ORAL | Status: AC
  Administered 2019-03-08: 10 mg via ORAL
  Filled 2019-03-08: qty 2

## 2019-03-08 MED ORDER — DIPHENHYDRAMINE HCL 25 MG PO CAPS
25.0000 mg | ORAL_CAPSULE | ORAL | Status: AC
  Administered 2019-03-08: 25 mg via ORAL

## 2019-03-08 MED ORDER — SODIUM CHLORIDE 0.9 % IV SOLN
INTRAVENOUS | Status: DC
  Administered 2019-03-08: 09:00:00 via INTRAVENOUS

## 2019-03-08 MED ORDER — HYDROCODONE-ACETAMINOPHEN 5-325 MG PO TABS: 1.0000 | ORAL_TABLET | ORAL | 0 refills | Status: DC | PRN

## 2019-03-08 MED ORDER — TAMSULOSIN HCL 0.4 MG PO CAPS: 0.4000 mg | ORAL_CAPSULE | Freq: Every day | ORAL | 0 refills | Status: DC

## 2019-03-08 MED ORDER — ONDANSETRON HCL 4 MG/2ML IJ SOLN
INTRAMUSCULAR | Status: AC
  Administered 2019-03-08: 4 mg via INTRAVENOUS
  Filled 2019-03-08: qty 2

## 2019-03-08 MED ORDER — ONDANSETRON HCL 4 MG/2ML IJ SOLN
4.0000 mg | Freq: Once | INTRAMUSCULAR | Status: AC
  Administered 2019-03-08: 4 mg via INTRAVENOUS

## 2019-03-08 MED ORDER — CIPROFLOXACIN HCL 500 MG PO TABS
ORAL_TABLET | ORAL | Status: AC
  Administered 2019-03-08: 09:00:00 500 mg via ORAL
  Filled 2019-03-08: qty 1

## 2019-03-08 MED ORDER — CIPROFLOXACIN HCL 500 MG PO TABS
500.0000 mg | ORAL_TABLET | ORAL | Status: AC
  Administered 2019-03-08: 500 mg via ORAL

## 2019-03-08 MED ORDER — DIAZEPAM 5 MG PO TABS
10.0000 mg | ORAL_TABLET | ORAL | Status: AC
  Administered 2019-03-08: 10 mg via ORAL

## 2019-03-08 MED ORDER — DIPHENHYDRAMINE HCL 25 MG PO CAPS
ORAL_CAPSULE | ORAL | Status: AC
  Administered 2019-03-08: 25 mg via ORAL
  Filled 2019-03-08: qty 1

## 2019-03-08 NOTE — Interval H&P Note (Signed)
History and Physical Interval Note:  03/08/2019 3:02 PM  Andrea Castillo  has presented today for surgery, with the diagnosis of right kidney stone.  The various methods of treatment have been discussed with the patient and family. After consideration of risks, benefits and other options for treatment, the patient has consented to  Procedure(s): EXTRACORPOREAL SHOCK WAVE LITHOTRIPSY (ESWL) (Right) as a surgical intervention.  The patient's history has been reviewed, patient examined, no change in status, stable for surgery.  I have reviewed the patient's chart and labs.  Questions were answered to the patient's satisfaction.     [name] [name]

## 2019-03-08 NOTE — Discharge Instructions (Addendum)
AMBULATORY SURGERY  DISCHARGE INSTRUCTIONS   1) The drugs that you were given will stay in your system until tomorrow so for the next 24 hours you should not:  A) Drive an automobile B) Make any legal decisions C) Drink any alcoholic beverage   2) You may resume regular meals tomorrow.  Today it is better to start with liquids and gradually work up to solid foods.  You may eat anything you prefer, but it is better to start with liquids, then soup and crackers, and gradually work up to solid foods.   3) Please notify your doctor immediately if you have any unusual bleeding, trouble breathing, redness and pain at the surgery site, drainage, fever, or pain not relieved by medication.    4) Additional Instructions:    Follow [employer] discharge instruction sheet as reviewed.  Prescriptions for pain medication and tamsulosin (a medication which will help you pass stone fragments) were sent to your pharmacy.  You will be contacted for an office follow-up.           Please contact your physician with any problems or Same Day Surgery at [phone], Monday through Friday 6 am to 4 pm, or [place] at [facility] number at [phone].

## 2019-03-12 ENCOUNTER — Telehealth: Payer: Self-pay | Admitting: Urology

## 2019-03-12 ENCOUNTER — Other Ambulatory Visit: Payer: Self-pay | Admitting: Urology

## 2019-03-12 DIAGNOSIS — N201 Calculus of ureter: Secondary | ICD-10-CM

## 2019-03-12 NOTE — Telephone Encounter (Signed)
Made app and lm for patient to cb to confirm it Remind patient to get her KUB  Andrea Castillo

## 2019-03-12 NOTE — Telephone Encounter (Signed)
-----   Message from Riki Altes, MD sent at 03/08/2019 12:29 PM EDT ----- Regarding: Follow-up Please schedule 2 week post lithotripsy follow-up with KUB

## 2019-04-10 ENCOUNTER — Encounter: Payer: Self-pay | Admitting: Urology

## 2019-04-10 ENCOUNTER — Ambulatory Visit
Admission: RE | Admit: 2019-04-10 | Discharge: 2019-04-10 | Disposition: A | Discharge status: Home / Self Care | Payer: Managed Care, Other (non HMO) | Attending: Urology | Admitting: Urology

## 2019-04-10 ENCOUNTER — Ambulatory Visit (INDEPENDENT_AMBULATORY_CARE_PROVIDER_SITE_OTHER): Payer: Managed Care, Other (non HMO) | Admitting: Urology

## 2019-04-10 ENCOUNTER — Ambulatory Visit
Admission: RE | Admit: 2019-04-10 | Discharge: 2019-04-10 | Disposition: A | Discharge status: Home / Self Care | Payer: Managed Care, Other (non HMO) | Source: Ambulatory Visit | Attending: Urology | Admitting: Urology

## 2019-04-10 VITALS — BP 126/86 | HR 81 | Ht 61.5 in | Wt 161.4 lb

## 2019-04-10 DIAGNOSIS — N201 Calculus of ureter: Secondary | ICD-10-CM

## 2019-04-10 DIAGNOSIS — N200 Calculus of kidney: Secondary | ICD-10-CM

## 2019-04-10 DIAGNOSIS — Z09 Encounter for follow-up examination after completed treatment for conditions other than malignant neoplasm: Secondary | ICD-10-CM

## 2019-04-11 ENCOUNTER — Encounter: Payer: Self-pay | Admitting: Urology

## 2019-04-11 LAB — URINALYSIS, COMPLETE
Bilirubin, UA: NEGATIVE
Glucose, UA: NEGATIVE
Ketones, UA: NEGATIVE
Leukocytes,UA: NEGATIVE
Nitrite, UA: NEGATIVE
Protein,UA: NEGATIVE
Specific Gravity, UA: 1.02 (ref 1.005–1.030)
Urobilinogen, Ur: 0.2 mg/dL (ref 0.2–1.0)
pH, UA: 6.5 (ref 5.0–7.5)

## 2019-04-11 LAB — MICROSCOPIC EXAMINATION

## 2019-04-11 NOTE — Progress Notes (Signed)
04/10/2019 7:57 AM   Andrea Castillo 1991-01-13 [id number]  Referring provider: Thomes DinningWeeks, Cynthia, MD [address] [place] [id number]  Chief Complaint  Patient presents with  . Nephrolithiasis    HPI: 28 year old female presents for postop follow-up.  She is status post shockwave lithotripsy of a 6 mm right distal ureteral calculus on 03/08/2019.  She had mild right lower quadrant abdominal pain for 3 days after the procedure.  She did pass several fine fragments which she brought in today.  After 3 days her pain resolved and she remains asymptomatic.  She has no complaints today.  This was her first stone.  She had a punctate right renal calculus on CT.   PMH: Past Medical History:  Diagnosis Date  . Kidney stone     Surgical History: Past Surgical History:  Procedure Laterality Date  . EXTRACORPOREAL SHOCK WAVE LITHOTRIPSY Right 03/08/2019   Procedure: EXTRACORPOREAL SHOCK WAVE LITHOTRIPSY (ESWL);  Surgeon: Riki AltesStoioff, Scott C, MD;  Location: [facility] ORS;  Service: Urology;  Laterality: Right;    Home Medications:  Allergies as of 04/10/2019   No Known Allergies     Medication List       Accurate as of April 10, 2019 11:59 PM. If you have any questions, ask your nurse or doctor.        STOP taking these medications   HYDROcodone-acetaminophen 5-325 MG tablet Commonly known as: Norco Stopped by: Riki AltesScott C Stoioff, MD   naproxen 500 MG tablet Commonly known as: Naprosyn Stopped by: Riki AltesScott C Stoioff, MD   ondansetron 4 MG disintegrating tablet Commonly known as: Zofran ODT Stopped by: Riki AltesScott C Stoioff, MD   tamsulosin 0.4 MG Caps capsule Commonly known as: FLOMAX Stopped by: Riki AltesScott C Stoioff, MD     TAKE these medications   Aviane 0.1-20 MG-MCG tablet Generic drug: levonorgestrel-ethinyl estradiol Take 1 tablet by mouth daily.       Allergies: No Known Allergies  Family History: No family history on file.  Social History:  reports that  she has never smoked. She has never used smokeless tobacco. She reports previous alcohol use. She reports previous drug use.  ROS: UROLOGY Frequent Urination?: No Hard to postpone urination?: No Burning/pain with urination?: No Get up at night to urinate?: No Leakage of urine?: No Urine stream starts and stops?: No Trouble starting stream?: No Do you have to strain to urinate?: No Blood in urine?: No Urinary tract infection?: No Sexually transmitted disease?: No Injury to kidneys or bladder?: No Painful intercourse?: No Weak stream?: No Currently pregnant?: No Vaginal bleeding?: No  Gastrointestinal Nausea?: No Vomiting?: No Indigestion/heartburn?: No Diarrhea?: No Constipation?: No  Constitutional Fever: No Night sweats?: No Weight loss?: No Fatigue?: No  Skin Skin rash/lesions?: No Itching?: No  Eyes Blurred vision?: No Double vision?: No  Ears/Nose/Throat Sore throat?: No Sinus problems?: No  Hematologic/Lymphatic Swollen glands?: No Easy bruising?: No  Cardiovascular Leg swelling?: No Chest pain?: No  Respiratory Cough?: No Shortness of breath?: No  Endocrine Excessive thirst?: No  Musculoskeletal Back pain?: No Joint pain?: No  Neurological Headaches?: No Dizziness?: No  Psychologic Depression?: No Anxiety?: No  Physical Exam: BP 126/86 (BP Location: Left Arm, Patient Position: Sitting, Cuff Size: Normal)   Pulse 81   Ht 5' 1.5" (1.562 m)   Wt 161 lb 6.4 oz (73.2 kg)   LMP 04/07/2019   BMI 30.00 kg/m   Constitutional:  Alert and oriented, No acute distress.     Assessment &  Plan:    Doing well status post shockwave lithotripsy of a 6 mm right distal ureteral calculus.  KUB performed today was reviewed and the previously seen calculus is no longer identified.  Stone fragments were sent for analysis.  She does not desire to pursue a metabolic evaluation.  General stone prevention guidelines were discussed and she was  provided literature.  It was recommended she increase her water intake to keep urine output greater than 2 L per day.  Ten 10 ounce glasses of water per day is generally enough to produce this output.  Oxalate moderation was discussed and she was provided literature on high oxalate foods and beverages.  Avoidance of salty foods and added salt was discussed as well as avoidance of excessive intake of animal protein.  Increased intake of potassium rich citrus products was recommended.  Follow-up 6 months with a KUB.  Abbie Sons, [place] [address] [place], [place] [address] [phone]

## 2019-04-12 ENCOUNTER — Telehealth: Payer: Self-pay

## 2019-04-12 NOTE — Telephone Encounter (Signed)
-----   Message from Abbie Sons, MD sent at 04/12/2019 12:46 PM EDT ----- KUB shows no residual stones or stone fragments.  Please schedule 34-month follow-up with a KUB

## 2019-04-12 NOTE — Telephone Encounter (Signed)
Patient notified

## 2019-04-17 ENCOUNTER — Other Ambulatory Visit: Payer: Self-pay | Admitting: Urology

## 2019-04-18 ENCOUNTER — Telehealth: Payer: Self-pay

## 2019-04-18 NOTE — Telephone Encounter (Signed)
-----   Message from Abbie Sons, MD sent at 04/18/2019 11:23 AM EDT -----  Joaquim Lai analysis was calcium oxalate which we discussed at last visit. F/u as sched ----- Message ----- From: Noralyn Pick Sent: 04/17/2019   4:54 PM EDT To: Abbie Sons, MD

## 2019-04-18 NOTE — Telephone Encounter (Signed)
Called patient no answer could not leave vmail box was full

## 2019-04-19 NOTE — Telephone Encounter (Signed)
Patient notified

## 2019-10-16 ENCOUNTER — Ambulatory Visit: Payer: Managed Care, Other (non HMO) | Admitting: Urology

## 2019-10-17 ENCOUNTER — Ambulatory Visit: Payer: Managed Care, Other (non HMO) | Admitting: Urology

## 2019-10-18 ENCOUNTER — Encounter: Payer: Self-pay | Admitting: Urology

## 2020-10-30 IMAGING — CT CT RENAL STONE PROTOCOL
3 of 4 series · 9 of 46 positions shown, 16 images · non-contrast
Comparison: None.

CLINICAL DATA: Right-sided pain for several hours

EXAM:
CT ABDOMEN AND PELVIS WITHOUT CONTRAST
TECHNIQUE: Multidetector CT imaging of the abdomen and pelvis was performed
following the standard protocol without IV contrast.

[Series 4: lung bases · axial · 0.73mm/px · z∈[-358,-278]mm · 5 of 25 slices shown, 10 images]
[im 5/25  soft-tissue]
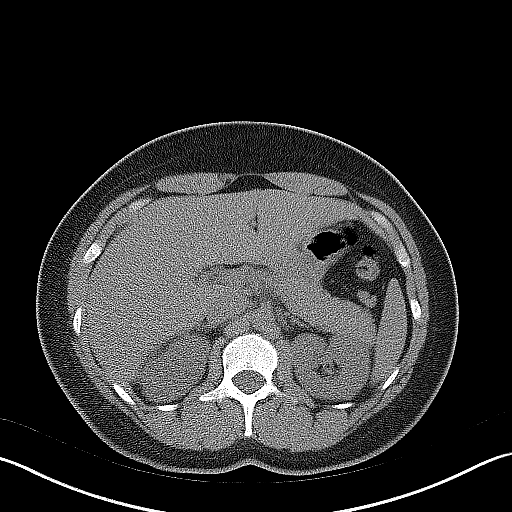
[im 5/25  bone]
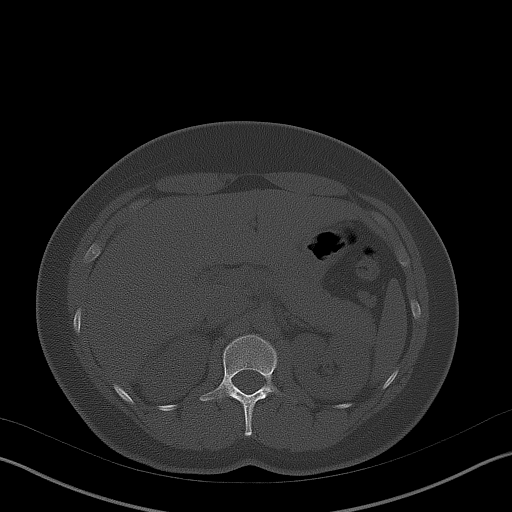
[im 9/25  soft-tissue]
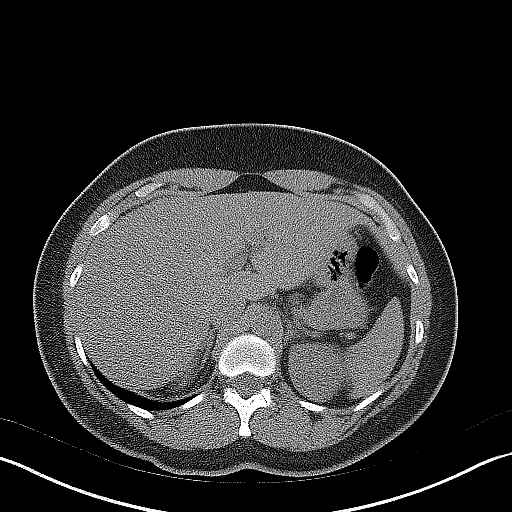
[im 9/25  lung]
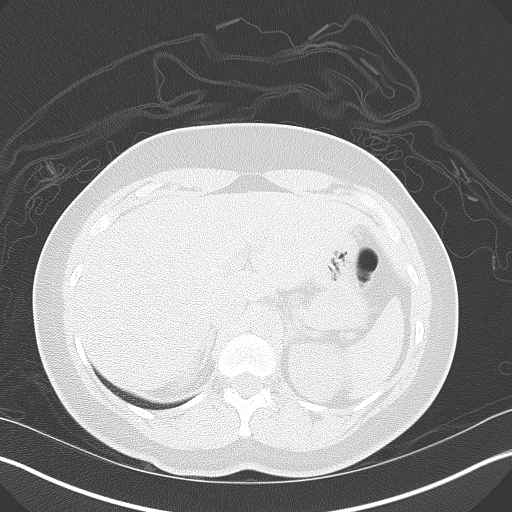
[im 13/25  soft-tissue]
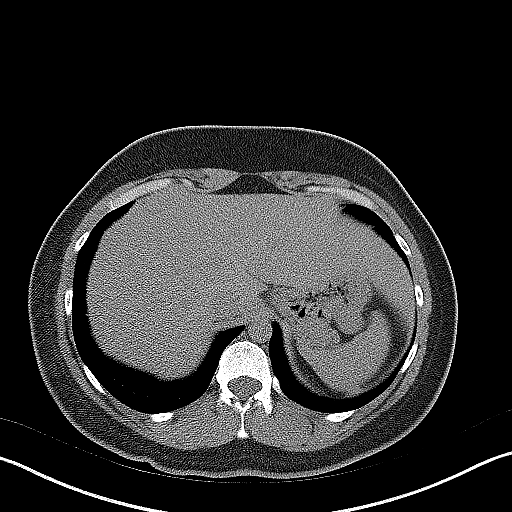
[im 13/25  lung]
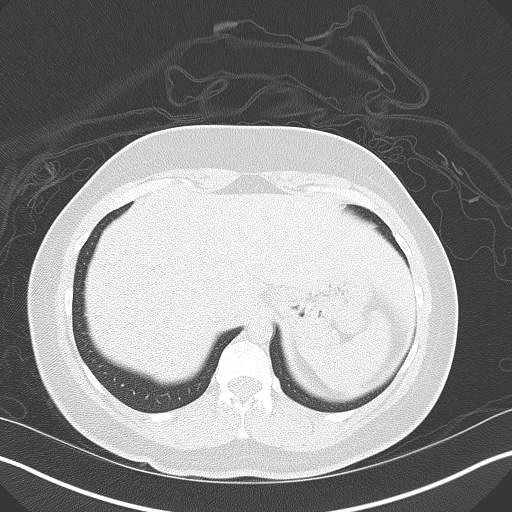
[im 17/25  soft-tissue]
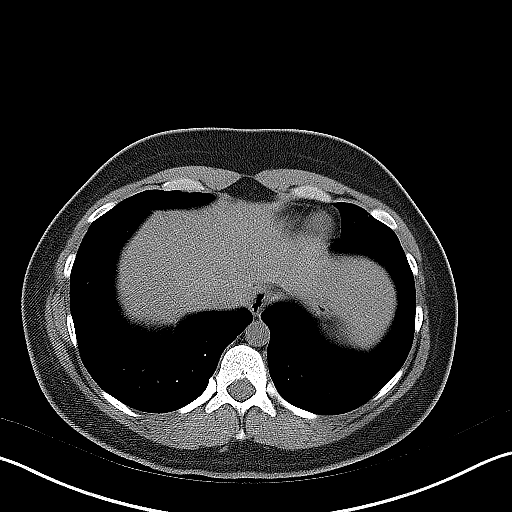
[im 17/25  lung]
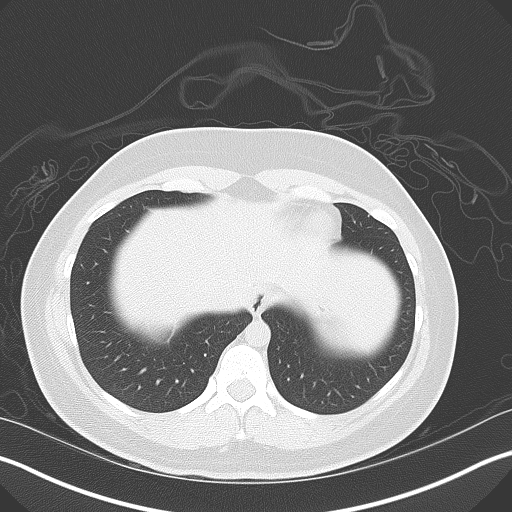
[im 21/25  soft-tissue]
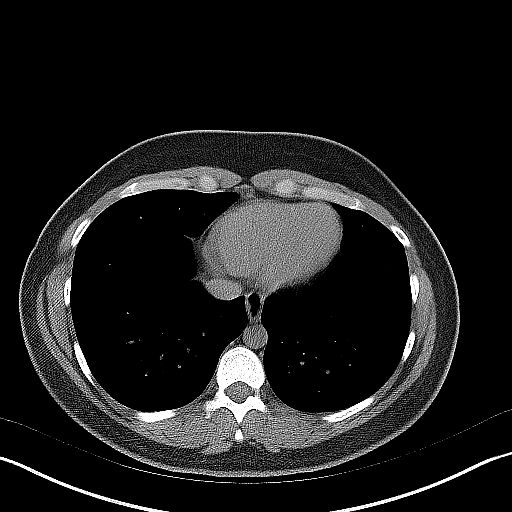
[im 21/25  lung]
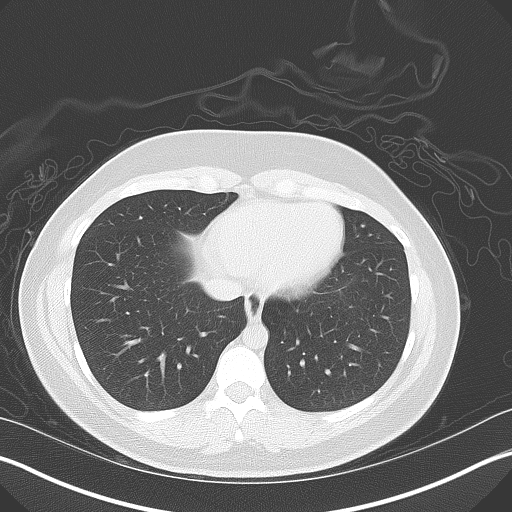

[Series 5: coronal · coronal · 0.68mm/px · 3 of 125 slices shown, 4 images]
[im 42/125  soft-tissue]
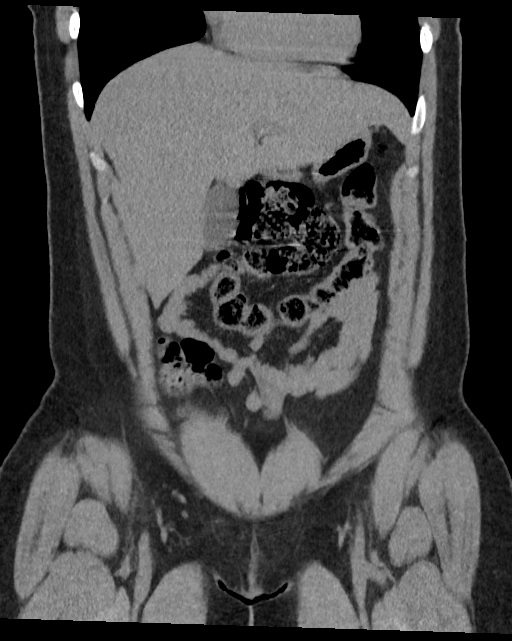
[im 56/125  soft-tissue]
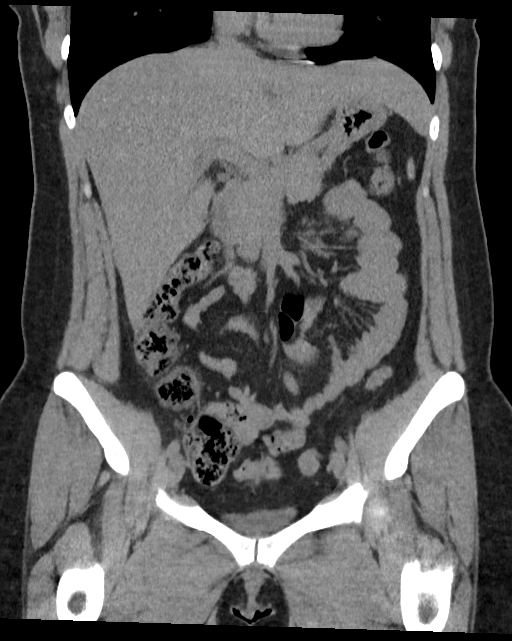
[im 56/125  bone]
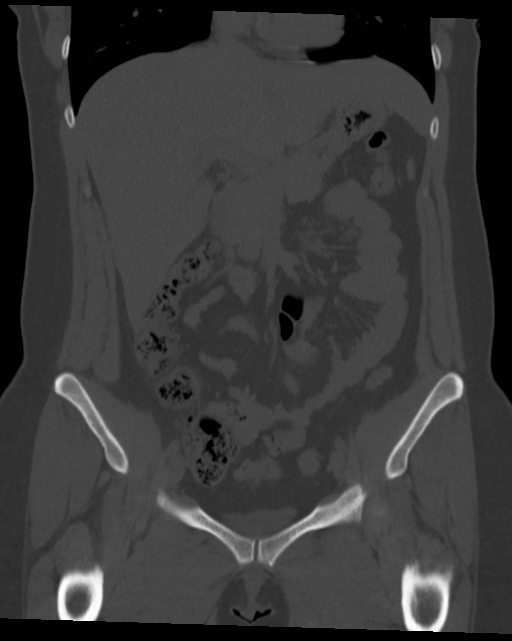
[im 69/125  soft-tissue]
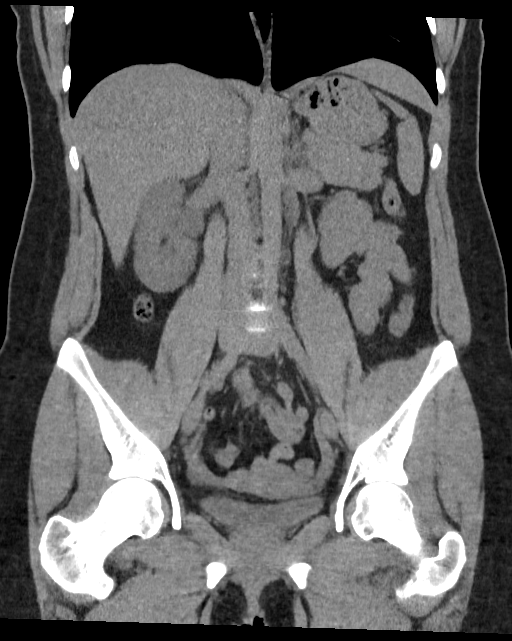

[Series 6: sagittal · sagittal · 0.53mm/px · 1 of 168 slices shown, 2 images]
[im 56/168  soft-tissue]
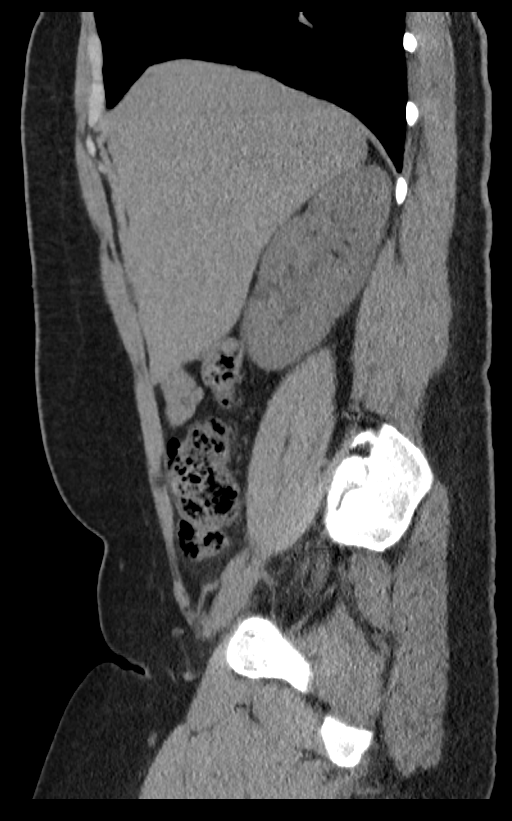
[im 56/168  bone]
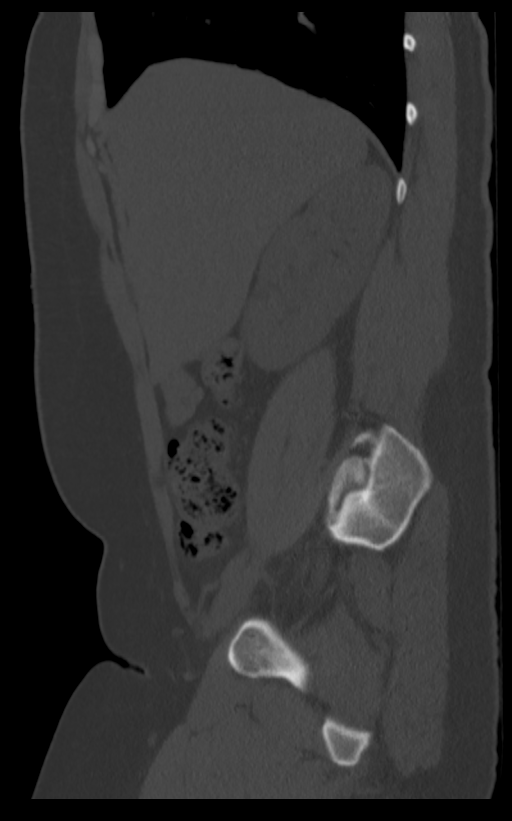

[9 of 46 positions shown; findings below may reference images not displayed]

FINDINGS: Lower chest: No acute abnormality.

Hepatobiliary: No focal liver abnormality is seen. No gallstones,
gallbladder wall thickening, or biliary dilatation.

Pancreas: Unremarkable. No pancreatic ductal dilatation or
surrounding inflammatory changes.

Spleen: Normal in size without focal abnormality.

Adrenals/Urinary Tract: Adrenal glands are within normal limits.
Left kidney shows no renal calculi or obstructive changes. The right
kidney demonstrates decreased attenuation with mild hydronephrosis
and hydroureter identified. A tiny mid right kidney nonobstructing
stone is seen. The right ureter is prominent extending inferiorly to
the level of the ureterovesical junction. A 6 mm stone is noted at
the level of the right ureterovesical junction causing the
obstructive change. The bladder is decompressed.

Stomach/Bowel: The appendix is within normal limits. No obstructive
or inflammatory changes of large or small bowel are seen. The
stomach is within normal limits.

Vascular/Lymphatic: No significant vascular findings are present. No
enlarged abdominal or pelvic lymph nodes.

Reproductive: Uterus and bilateral adnexa are unremarkable.

Other: No abdominal wall hernia or abnormality. No abdominopelvic
ascites.

Musculoskeletal: No acute or significant osseous findings.
IMPRESSION: 6 mm distal right ureteral stone with obstructive change.

Tiny nonobstructing right renal stone.

## 2020-11-04 IMAGING — CR ABDOMEN - 1 VIEW
1 series · 1 of 1 positions shown · non-contrast
Comparison: CT abdomen and pelvis March 03, 2019 and abdominal
radiograph March 07, 2019

CLINICAL DATA: Ureteral calculus on the right. Preoperative
lithotripsy

EXAM:
ABDOMEN - 1 VIEW

[abdomen kub]
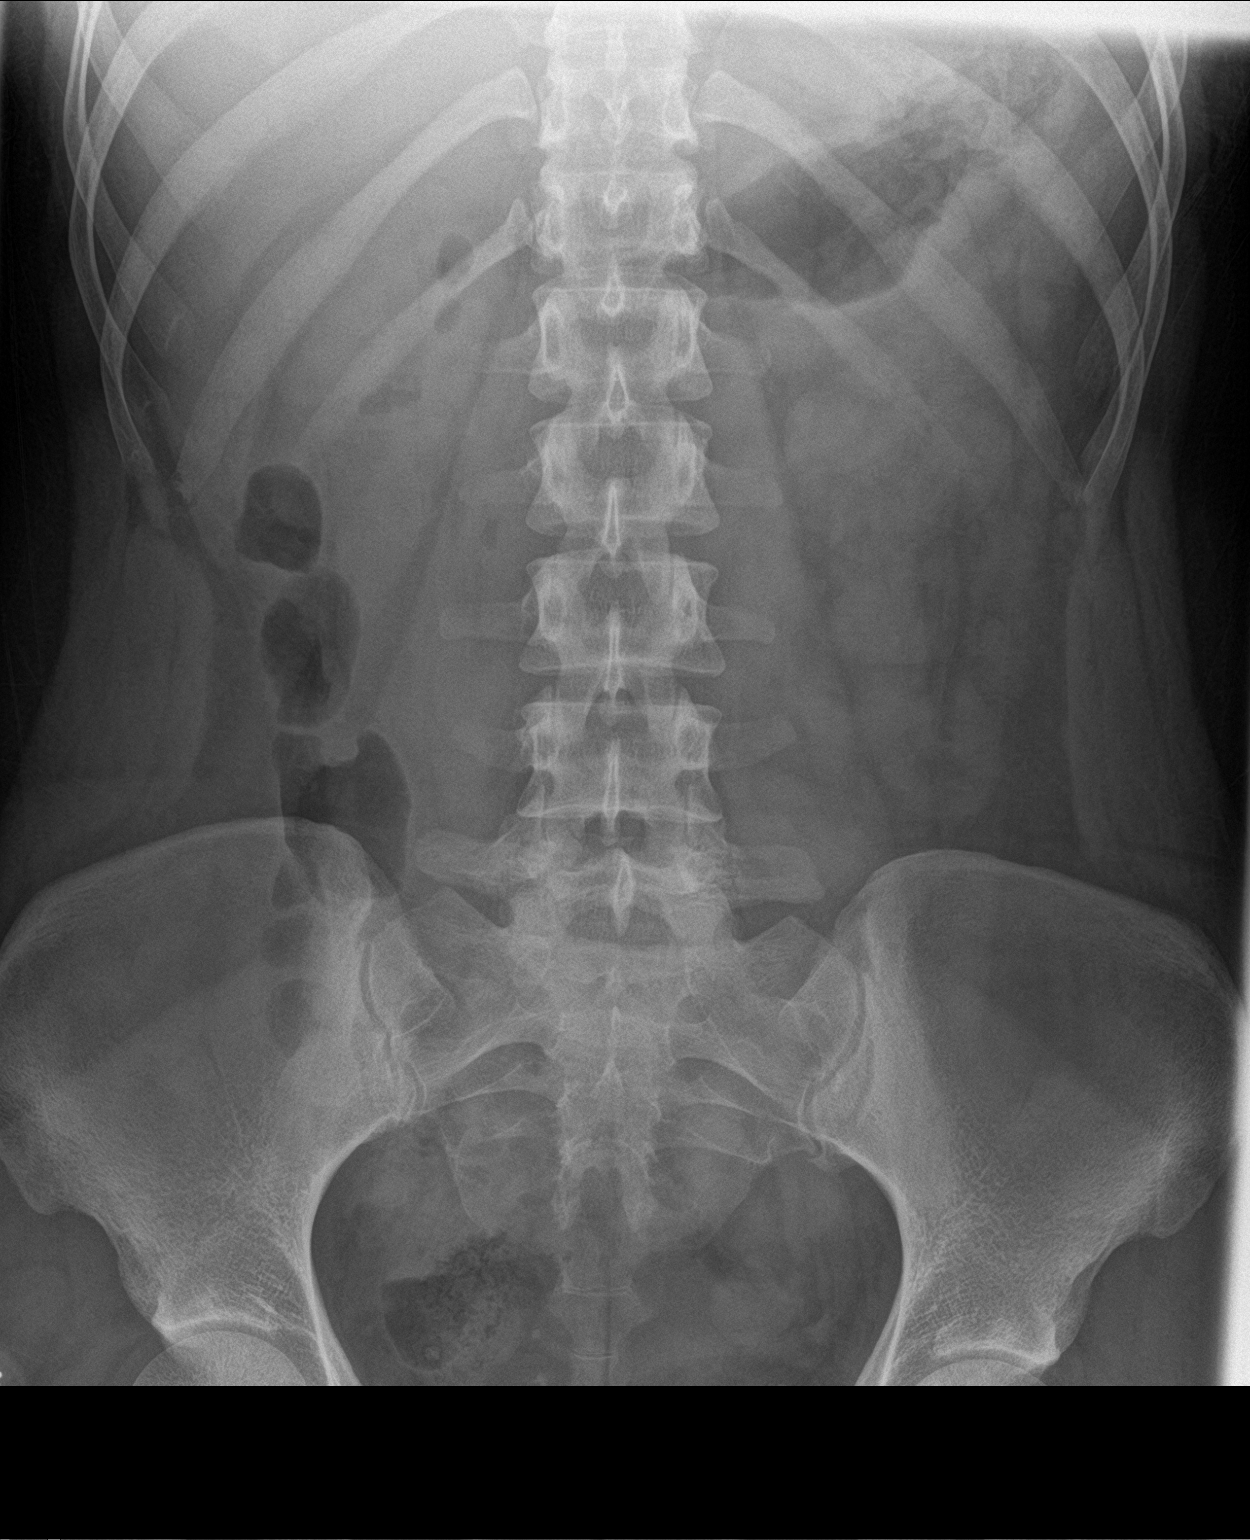

[1 of 1 positions shown; findings below may reference images not displayed]

FINDINGS: 3 mm apparent distal ureteral calculus in the medial lower right
pelvis is stable. Other calcifications in the pelvis are stable and
likely represent phleboliths. No new calcifications evident.

There is no bowel dilatation or air-fluid level to suggest bowel
obstruction. No free air.
IMPRESSION: Stable 3 mm presumed distal ureteral calculus in the medial lower
right pelvis. No new calcifications evident. No bowel obstruction or
free air evident.

## 2020-12-07 IMAGING — CR ABDOMEN - 1 VIEW
1 series · 2 of 2 positions shown · non-contrast
Comparison: 03/08/2019

CLINICAL DATA: Ureteral calculus post lithotripsy

EXAM:
ABDOMEN - 1 VIEW

[Series 1: dg abd 1 view · 0.14mm/px · 2 of 2 slices shown]
[im 1/2]
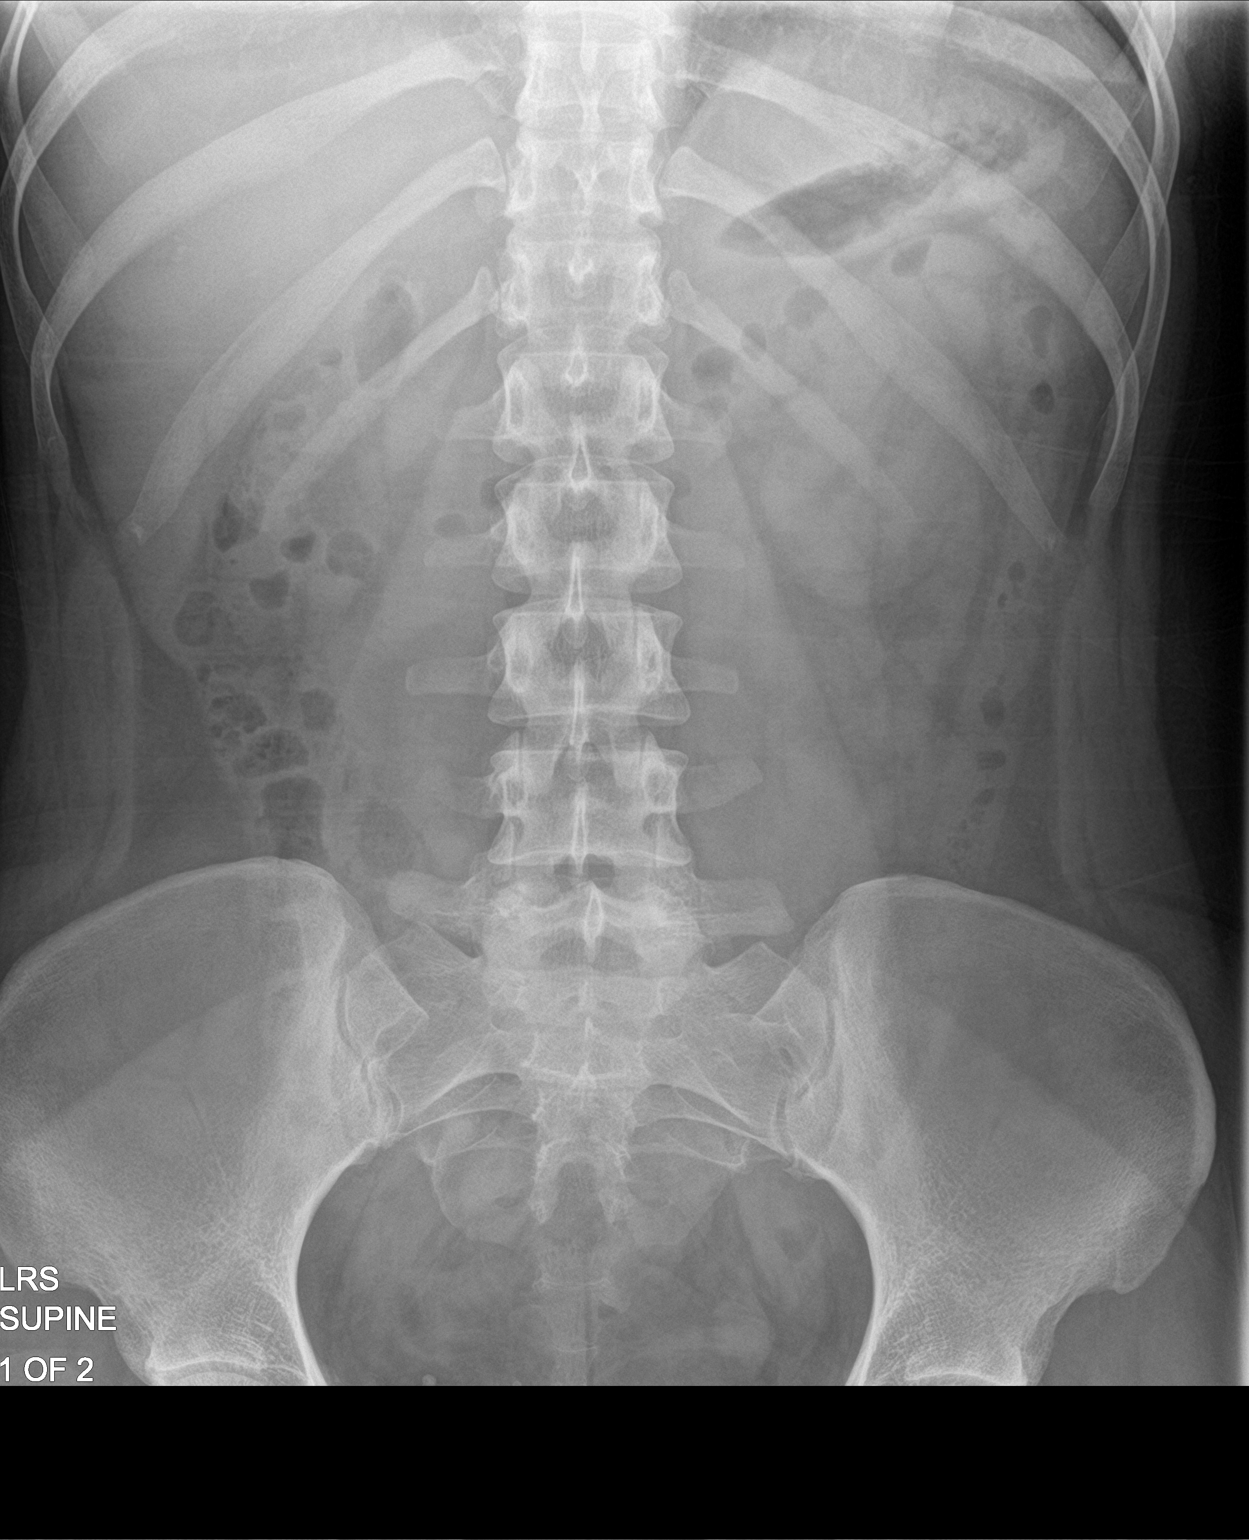
[im 2/2]
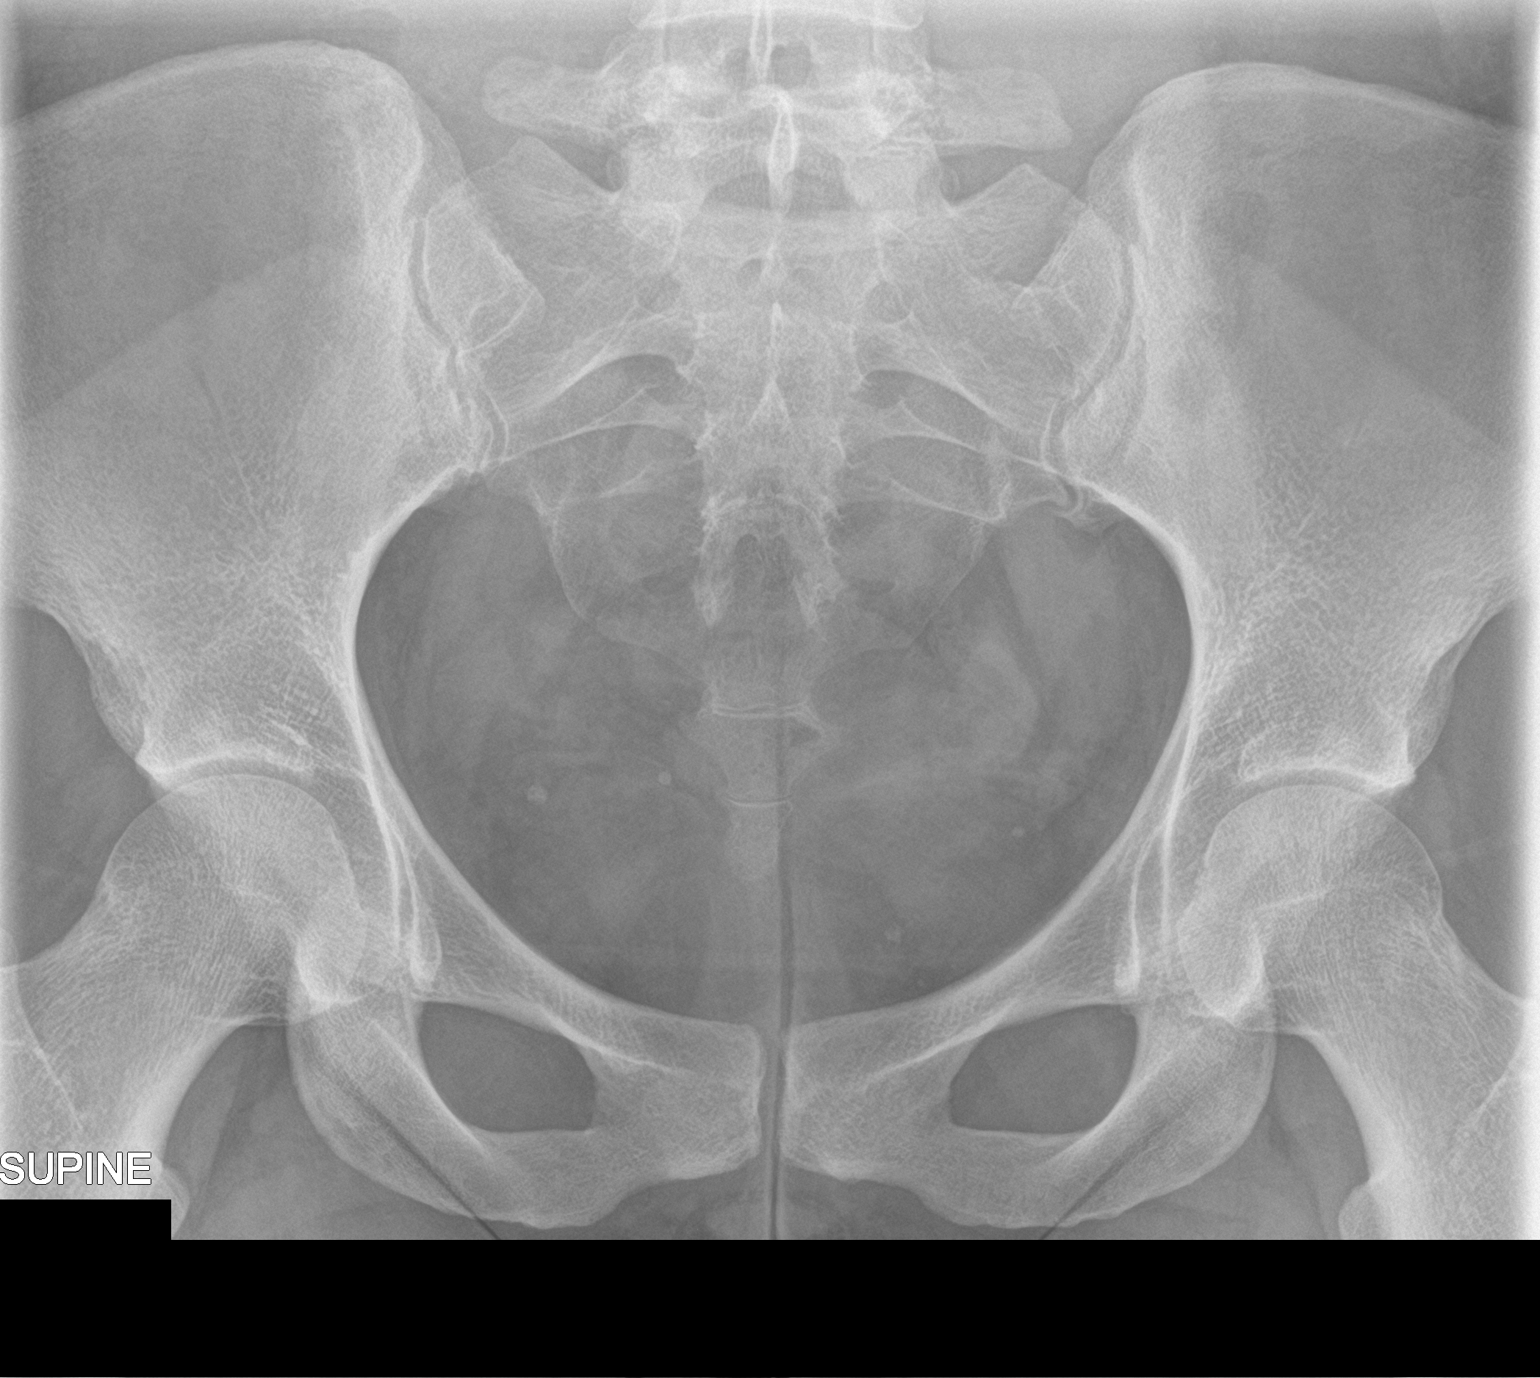

[2 of 2 positions shown; findings below may reference images not displayed]

FINDINGS: Calcification to the right of the tip of the coccyx no longer
present compatible with passed ureteral calculus. Residual
phleboliths are present bilaterally.

No other renal calculi.  Normal bowel gas pattern.
IMPRESSION: Calculus distal right ureter has passed.  No residual renal calculi.
